# Patient Record
Sex: Female | Born: 1990 | Race: Black or African American | Hispanic: No | Marital: Single | Smoking: Current every day smoker
Health system: Southern US, Community
[De-identification: ages and names within clinical notes are randomized; demographics above are authoritative.]

---
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
## 2008-06-11 ENCOUNTER — Emergency Department (HOSPITAL_COMMUNITY): Admission: EM | Admit: 2008-06-11 | Discharge: 2008-06-11 | Payer: Self-pay | Admitting: Emergency Medicine

## 2008-11-10 ENCOUNTER — Emergency Department (HOSPITAL_COMMUNITY): Admission: EM | Admit: 2008-11-10 | Discharge: 2008-11-10 | Payer: Self-pay | Admitting: Emergency Medicine

## 2008-12-25 ENCOUNTER — Emergency Department (HOSPITAL_COMMUNITY): Admission: EM | Admit: 2008-12-25 | Discharge: 2008-12-25 | Payer: Self-pay | Admitting: Emergency Medicine

## 2014-08-31 ENCOUNTER — Emergency Department (HOSPITAL_COMMUNITY)
Admission: EM | Admit: 2014-08-31 | Discharge: 2014-08-31 | Disposition: A | Discharge status: Home / Self Care | Payer: No Typology Code available for payment source | Attending: Emergency Medicine | Admitting: Emergency Medicine

## 2014-08-31 ENCOUNTER — Encounter (HOSPITAL_COMMUNITY): Payer: Self-pay | Admitting: Emergency Medicine

## 2014-08-31 ENCOUNTER — Emergency Department (HOSPITAL_COMMUNITY): Payer: No Typology Code available for payment source

## 2014-08-31 DIAGNOSIS — Z720 Tobacco use: Secondary | ICD-10-CM | POA: Diagnosis not present

## 2014-08-31 DIAGNOSIS — Z041 Encounter for examination and observation following transport accident: Secondary | ICD-10-CM | POA: Diagnosis present

## 2014-08-31 DIAGNOSIS — Y9389 Activity, other specified: Secondary | ICD-10-CM | POA: Insufficient documentation

## 2014-08-31 DIAGNOSIS — Y999 Unspecified external cause status: Secondary | ICD-10-CM | POA: Insufficient documentation

## 2014-08-31 DIAGNOSIS — Y92410 Unspecified street and highway as the place of occurrence of the external cause: Secondary | ICD-10-CM | POA: Diagnosis not present

## 2014-08-31 MED ORDER — METHOCARBAMOL 500 MG PO TABS: 500.0000 mg | ORAL_TABLET | Freq: Two times a day (BID) | ORAL | Status: DC

## 2014-08-31 MED ORDER — NAPROXEN 500 MG PO TABS: 500.0000 mg | ORAL_TABLET | Freq: Two times a day (BID) | ORAL | Status: DC
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
## 2014-08-31 NOTE — ED Notes (Signed)
Pt ambulatory with steady gait to radiology 

## 2015-03-04 ENCOUNTER — Encounter (HOSPITAL_COMMUNITY): Payer: Self-pay

## 2015-03-04 DIAGNOSIS — Z79899 Other long term (current) drug therapy: Secondary | ICD-10-CM | POA: Insufficient documentation

## 2015-03-04 DIAGNOSIS — B349 Viral infection, unspecified: Secondary | ICD-10-CM | POA: Insufficient documentation

## 2015-03-04 DIAGNOSIS — R42 Dizziness and giddiness: Secondary | ICD-10-CM | POA: Insufficient documentation

## 2015-03-04 DIAGNOSIS — F17200 Nicotine dependence, unspecified, uncomplicated: Secondary | ICD-10-CM | POA: Insufficient documentation

## 2015-03-04 DIAGNOSIS — Z791 Long term (current) use of non-steroidal anti-inflammatories (NSAID): Secondary | ICD-10-CM | POA: Insufficient documentation

## 2015-03-04 DIAGNOSIS — Z3202 Encounter for pregnancy test, result negative: Secondary | ICD-10-CM | POA: Insufficient documentation

## 2015-03-04 LAB — CBC
HCT: 36.8 % (ref 36.0–46.0)
Hemoglobin: 12.3 g/dL (ref 12.0–15.0)
MCH: 28 pg (ref 26.0–34.0)
MCHC: 33.4 g/dL (ref 30.0–36.0)
MCV: 83.6 fL (ref 78.0–100.0)
PLATELETS: 337 10*3/uL (ref 150–400)
RBC: 4.4 MIL/uL (ref 3.87–5.11)
RDW: 13 % (ref 11.5–15.5)
WBC: 5.3 10*3/uL (ref 4.0–10.5)

## 2015-03-04 LAB — URINALYSIS, ROUTINE W REFLEX MICROSCOPIC
Bilirubin Urine: NEGATIVE
GLUCOSE, UA: NEGATIVE mg/dL
Ketones, ur: 15 mg/dL — AB
LEUKOCYTES UA: NEGATIVE
Nitrite: NEGATIVE
PROTEIN: NEGATIVE mg/dL
Specific Gravity, Urine: 1.028 (ref 1.005–1.030)
pH: 6 (ref 5.0–8.0)

## 2015-03-04 LAB — COMPREHENSIVE METABOLIC PANEL
ALT: 20 U/L (ref 14–54)
AST: 26 U/L (ref 15–41)
Albumin: 4.1 g/dL (ref 3.5–5.0)
Alkaline Phosphatase: 50 U/L (ref 38–126)
Anion gap: 10 (ref 5–15)
BUN: 12 mg/dL (ref 6–20)
CHLORIDE: 104 mmol/L (ref 101–111)
CO2: 24 mmol/L (ref 22–32)
CREATININE: 0.86 mg/dL (ref 0.44–1.00)
Calcium: 9.5 mg/dL (ref 8.9–10.3)
GFR calc non Af Amer: >60 mL/min (ref >60)
Glucose, Bld: 84 mg/dL (ref 65–99)
POTASSIUM: 3.8 mmol/L (ref 3.5–5.1)
SODIUM: 138 mmol/L (ref 135–145)
Total Bilirubin: 0.4 mg/dL (ref 0.3–1.2)
Total Protein: 6.7 g/dL (ref 6.5–8.1)

## 2015-03-04 LAB — URINE MICROSCOPIC-ADD ON

## 2015-03-04 LAB — LIPASE, BLOOD: LIPASE: 36 U/L (ref 11–51)

## 2015-03-04 LAB — POC URINE PREG, ED: PREG TEST UR: NEGATIVE

## 2015-03-04 NOTE — ED Notes (Signed)
Pt here for abd pain and diarrhea for the past three days. Feels nauseated but no vomiting. Has nasal congestion. Wants to sleep, has no energy.

## 2015-03-05 ENCOUNTER — Emergency Department (HOSPITAL_COMMUNITY)
Admission: EM | Admit: 2015-03-05 | Discharge: 2015-03-05 | Disposition: A | Discharge status: Home / Self Care | Payer: Self-pay | Attending: Emergency Medicine | Admitting: Emergency Medicine

## 2015-03-05 DIAGNOSIS — R05 Cough: Secondary | ICD-10-CM

## 2015-03-05 DIAGNOSIS — R197 Diarrhea, unspecified: Secondary | ICD-10-CM

## 2015-03-05 DIAGNOSIS — R059 Cough, unspecified: Secondary | ICD-10-CM

## 2015-03-05 DIAGNOSIS — B349 Viral infection, unspecified: Secondary | ICD-10-CM

## 2015-03-05 NOTE — ED Notes (Signed)
Nurse first rounds: pt updated on wait time.  

## 2015-03-05 NOTE — ED Provider Notes (Signed)
CSN: [id number]     Arrival date & time 03/04/15  2128 History  By signing my name below, I, Bethel Born, attest that this documentation has been prepared under the direction and in the presence of Zadie Rhine, MD. Electronically Signed: Bethel Born, ED Scribe. 03/05/2015. 1:57 AM   Chief Complaint  Patient presents with  . Abdominal Pain   Patient is a 25 y.o. female presenting with dizziness. The history is provided by the patient. No language interpreter was used.  Dizziness Quality:  Room spinning Severity:  Mild Onset quality:  Gradual Duration:  2 days Timing:  Intermittent Progression:  Unchanged Chronicity:  New Context: not with loss of consciousness   Relieved by:  Nothing Worsened by:  Nothing Ineffective treatments:  None tried Associated symptoms: diarrhea, headaches, nausea and weakness (generalized)   Associated symptoms: no chest pain, no shortness of breath, no syncope, no vision changes and no vomiting    Michele Saunders is a 25 y.o. female who presents to the Emergency Department complaining of new intermittent dizziness with onset 2 days ago. Associated symptoms include generalized weakness, sweating, chills, headache, cough, sore throat, nasal discharge, 3-4 days of nausea, diarrhea. Pt denies LOC, fever, abdominal pain, dysuria, vaginal bleeding, chest pain, and SOB.    History reviewed. No pertinent past medical history. History reviewed. No pertinent past surgical history. No family history on file. Social History  Substance Use Topics  . Smoking status: Current Every Day Smoker  . Smokeless tobacco: None  . Alcohol Use: No   OB History    No data available     Review of Systems  Constitutional: Positive for chills and diaphoresis. Negative for fever.  HENT: Positive for congestion, rhinorrhea and sore throat.   Respiratory: Positive for cough. Negative for shortness of breath.   Cardiovascular: Negative for chest pain and syncope.   Gastrointestinal: Positive for nausea and diarrhea. Negative for vomiting and abdominal pain.  Genitourinary: Negative for dysuria and vaginal bleeding.  Neurological: Positive for dizziness, weakness (generalized) and headaches.  All other systems reviewed and are negative.  Allergies  Review of patient's allergies indicates no known allergies.  Home Medications   Prior to Admission medications   Medication Sig Start Date End Date Taking? Authorizing Provider  methocarbamol (ROBAXIN) 500 MG tablet Take 1 tablet (500 mg total) by mouth 2 (two) times daily. 08/31/14   Heather Laisure, PA-C  naproxen (NAPROSYN) 500 MG tablet Take 1 tablet (500 mg total) by mouth 2 (two) times daily. 08/31/14   Heather Laisure, PA-C   BP 116/60 mmHg  Pulse 72  Temp(Src) 98.2 F (36.8 C) (Oral)  Resp 18  Ht  (1.448 m)  Wt 99 lb (44.906 kg)  BMI 21.42 kg/m2  SpO2 100%  LMP 02/08/2015 Physical Exam CONSTITUTIONAL: Well developed/well nourished HEAD: Normocephalic/atraumatic EYES: EOMI/PERRL ENMT: Mucous membranes moist NECK: supple no meningeal signs SPINE/BACK:entire spine nontender CV: S1/S2 noted, no murmurs/rubs/gallops noted LUNGS: Lungs are clear to auscultation bilaterally, no apparent distress ABDOMEN: soft, nontender, no rebound or guarding, bowel sounds noted throughout abdomen GU:no cva tenderness NEURO: Pt is awake/alert/appropriate, moves all extremitiesx4.  No facial droop. No ataxia.  EXTREMITIES: pulses normal/equal, full ROM SKIN: warm, color normal PSYCH: no abnormalities of mood noted, alert and oriented to situation  ED Course  Procedures  DIAGNOSTIC STUDIES: Oxygen Saturation is 100% on RA,  normal by my interpretation.    COORDINATION OF CARE: 1:55 AM Discussed treatment plan which includes lab work with pt at  bedside and pt agreed to plan.  Labs Review Labs Reviewed  URINALYSIS, ROUTINE W REFLEX MICROSCOPIC (NOT AT [facility]) - Abnormal; Notable for the following:     APPearance CLOUDY (*)    Hgb urine dipstick MODERATE (*)    Ketones, ur 15 (*)    All other components within normal limits  URINE MICROSCOPIC-ADD ON - Abnormal; Notable for the following:    Squamous Epithelial / LPF 0-5 (*)    Bacteria, UA MANY (*)    All other components within normal limits  LIPASE, BLOOD  COMPREHENSIVE METABOLIC PANEL  CBC  POC URINE PREG, ED    I have personally reviewed and evaluated these lab results as part of my medical decision-making.    MDM   Final diagnoses:  Diarrhea, unspecified type  Cough  Viral illness    Nursing notes including past medical history and social history reviewed and considered in documentation Labs/vital reviewed myself and considered during evaluation   I personally performed the services described in this documentation, which was scribed in my presence. The recorded information has been reviewed and is accurate.       Zadie Rhine, MD 03/05/15 [phone]

## 2015-03-05 NOTE — Discharge Instructions (Signed)

## 2019-08-09 ENCOUNTER — Emergency Department (HOSPITAL_COMMUNITY): Payer: Self-pay

## 2019-08-09 ENCOUNTER — Encounter (HOSPITAL_COMMUNITY): Payer: Self-pay | Admitting: Emergency Medicine

## 2019-08-09 ENCOUNTER — Observation Stay (HOSPITAL_COMMUNITY)
Admission: EM | Admit: 2019-08-09 | Discharge: 2019-08-10 | Disposition: A | Discharge status: Home / Self Care | Payer: Self-pay | Attending: Internal Medicine | Admitting: Internal Medicine

## 2019-08-09 DIAGNOSIS — F1210 Cannabis abuse, uncomplicated: Secondary | ICD-10-CM | POA: Insufficient documentation

## 2019-08-09 DIAGNOSIS — Z20822 Contact with and (suspected) exposure to covid-19: Secondary | ICD-10-CM | POA: Insufficient documentation

## 2019-08-09 DIAGNOSIS — E876 Hypokalemia: Secondary | ICD-10-CM | POA: Insufficient documentation

## 2019-08-09 DIAGNOSIS — F17200 Nicotine dependence, unspecified, uncomplicated: Secondary | ICD-10-CM | POA: Insufficient documentation

## 2019-08-09 DIAGNOSIS — E860 Dehydration: Secondary | ICD-10-CM | POA: Insufficient documentation

## 2019-08-09 DIAGNOSIS — R112 Nausea with vomiting, unspecified: Principal | ICD-10-CM | POA: Diagnosis present

## 2019-08-09 DIAGNOSIS — Z79899 Other long term (current) drug therapy: Secondary | ICD-10-CM | POA: Insufficient documentation

## 2019-08-09 DIAGNOSIS — Z791 Long term (current) use of non-steroidal anti-inflammatories (NSAID): Secondary | ICD-10-CM | POA: Insufficient documentation

## 2019-08-09 LAB — CBC
HCT: 43.3 % (ref 36.0–46.0)
Hemoglobin: 13.7 g/dL (ref 12.0–15.0)
MCH: 27.3 pg (ref 26.0–34.0)
MCHC: 31.6 g/dL (ref 30.0–36.0)
MCV: 86.4 fL (ref 80.0–100.0)
Platelets: 345 10*3/uL (ref 150–400)
RBC: 5.01 MIL/uL (ref 3.87–5.11)
RDW: 13 % (ref 11.5–15.5)
WBC: 8.8 10*3/uL (ref 4.0–10.5)
nRBC: 0 % (ref 0.0–0.2)

## 2019-08-09 LAB — I-STAT BETA HCG BLOOD, ED (MC, WL, AP ONLY): I-stat hCG, quantitative: <5 m[IU]/mL (ref <5)

## 2019-08-09 LAB — URINALYSIS, ROUTINE W REFLEX MICROSCOPIC
Bacteria, UA: NONE SEEN
Bilirubin Urine: NEGATIVE
Glucose, UA: NEGATIVE mg/dL
Ketones, ur: 20 mg/dL — AB
Leukocytes,Ua: NEGATIVE
Nitrite: NEGATIVE
Protein, ur: NEGATIVE mg/dL
Specific Gravity, Urine: 1.045 — ABNORMAL HIGH (ref 1.005–1.030)
pH: 8 (ref 5.0–8.0)

## 2019-08-09 LAB — MAGNESIUM: Magnesium: 2.5 mg/dL — ABNORMAL HIGH (ref 1.7–2.4)

## 2019-08-09 LAB — COMPREHENSIVE METABOLIC PANEL
ALT: 24 U/L (ref 0–44)
AST: 22 U/L (ref 15–41)
Albumin: 4.9 g/dL (ref 3.5–5.0)
Alkaline Phosphatase: 45 U/L (ref 38–126)
Anion gap: 14 (ref 5–15)
BUN: 10 mg/dL (ref 6–20)
CO2: 25 mmol/L (ref 22–32)
Calcium: 9.3 mg/dL (ref 8.9–10.3)
Chloride: 104 mmol/L (ref 98–111)
Creatinine, Ser: 0.89 mg/dL (ref 0.44–1.00)
GFR calc Af Amer: >60 mL/min (ref >60)
GFR calc non Af Amer: >60 mL/min (ref >60)
Glucose, Bld: 88 mg/dL (ref 70–99)
Potassium: 3 mmol/L — ABNORMAL LOW (ref 3.5–5.1)
Sodium: 143 mmol/L (ref 135–145)
Total Bilirubin: 1.1 mg/dL (ref 0.3–1.2)
Total Protein: 8.1 g/dL (ref 6.5–8.1)

## 2019-08-09 LAB — LIPASE, BLOOD: Lipase: 34 U/L (ref 11–51)

## 2019-08-09 MED ORDER — SODIUM CHLORIDE (PF) 0.9 % IJ SOLN
INTRAMUSCULAR | Status: AC
  Filled 2019-08-09: qty 50

## 2019-08-09 MED ORDER — PROMETHAZINE HCL 25 MG/ML IJ SOLN
12.5000 mg | Freq: Once | INTRAMUSCULAR | Status: AC
  Administered 2019-08-09: 12.5 mg via INTRAVENOUS
  Filled 2019-08-09: qty 1

## 2019-08-09 MED ORDER — SODIUM CHLORIDE 0.9 % IV SOLN
80.0000 mg | Freq: Once | INTRAVENOUS | Status: AC
  Administered 2019-08-09: 80 mg via INTRAVENOUS
  Filled 2019-08-09: qty 80

## 2019-08-09 MED ORDER — MORPHINE SULFATE (PF) 4 MG/ML IV SOLN
4.0000 mg | Freq: Once | INTRAVENOUS | Status: AC
  Administered 2019-08-09: 4 mg via INTRAVENOUS
  Filled 2019-08-09: qty 1

## 2019-08-09 MED ORDER — SODIUM CHLORIDE 0.9 % IV BOLUS
1000.0000 mL | Freq: Once | INTRAVENOUS | Status: AC
  Administered 2019-08-09: 1000 mL via INTRAVENOUS

## 2019-08-09 MED ORDER — SODIUM CHLORIDE 0.9 % IV SOLN: Freq: Once | INTRAVENOUS | Status: AC

## 2019-08-09 MED ORDER — METOCLOPRAMIDE HCL 5 MG/ML IJ SOLN
10.0000 mg | Freq: Once | INTRAMUSCULAR | Status: AC
  Administered 2019-08-09: 10 mg via INTRAVENOUS
  Filled 2019-08-09: qty 2

## 2019-08-09 MED ORDER — DROPERIDOL 2.5 MG/ML IJ SOLN
1.2500 mg | Freq: Once | INTRAMUSCULAR | Status: AC
  Administered 2019-08-09: 1.25 mg via INTRAVENOUS
  Filled 2019-08-09: qty 2

## 2019-08-09 MED ORDER — POTASSIUM CHLORIDE 10 MEQ/100ML IV SOLN
10.0000 meq | INTRAVENOUS | Status: AC
  Administered 2019-08-09 – 2019-08-10 (×4): 10 meq via INTRAVENOUS
  Filled 2019-08-09 (×4): qty 100

## 2019-08-09 MED ORDER — IOHEXOL 300 MG/ML  SOLN
100.0000 mL | Freq: Once | INTRAMUSCULAR | Status: AC | PRN
  Administered 2019-08-09: 100 mL via INTRAVENOUS

## 2019-08-09 MED ORDER — SODIUM CHLORIDE 0.9% FLUSH: 3.0000 mL | Freq: Once | INTRAVENOUS | Status: DC

## 2019-08-09 NOTE — ED Notes (Signed)
Pt was given cranberry juice 

## 2019-08-09 NOTE — ED Triage Notes (Signed)
Patient here via EMS with complaints of abd pain, n/v that started 2 days ago. Left upper and lower abd pain.

## 2019-08-09 NOTE — ED Provider Notes (Signed)
Emergency Department Provider Note   I have reviewed the triage vital signs and the nursing notes.   HISTORY  Chief Complaint Abdominal Pain, Nausea, and Emesis   HPI Michele Saunders is a 29 y.o. female with no chronic past medical history presents to the emergency department with nausea and vomiting with diffuse abdominal pain over the past 2 days.  Patient has not had symptoms like this in the past.  Her pain is mostly in the left upper and lower abdomen.  She denies any vaginal bleeding or discharge.  She ate at [employer] and became sick shortly afterwards.  In addition to vomiting she has had some associated nonbloody diarrhea.  She denies fevers.  She smokes marijuana occasionally but not daily.  Denies alcohol use.  She reports going to a hospital in [place] yesterday where an initial work-up was done and she was ultimately discharged home.  She is unsure what exact tests and/or imaging were ordered.  She continues to have symptoms and so came here for evaluation as she knows someone in the area. Denies CP or SOB. No cough. No radiation of symptoms or modifying factors.   History reviewed. No pertinent past medical history.  There are no problems to display for this patient.   History reviewed. No pertinent surgical history.  Allergies Patient has no known allergies.  No family history on file.  Social History Social History   Tobacco Use  . Smoking status: Current Every Day Smoker  . Smokeless tobacco: Never Used  Substance Use Topics  . Alcohol use: No  . Drug use: No    Review of Systems  Constitutional: No fever/chills Eyes: No visual changes. ENT: No sore throat. Cardiovascular: Denies chest pain. Respiratory: Denies shortness of breath. Gastrointestinal: Left sided abdominal pain. Positive nausea, vomiting, and diarrhea.  No constipation. Genitourinary: Negative for dysuria. Musculoskeletal: Negative for back pain. Skin: Negative for rash. Neurological:  Negative for headaches, focal weakness or numbness.  10-point ROS otherwise negative.  ____________________________________________   PHYSICAL EXAM:  VITAL SIGNS: ED Triage Vitals  Enc Vitals Group     BP 08/09/19 1102 120/73     Pulse Rate 08/09/19 1102 63     Resp 08/09/19 1102 16     Temp 08/09/19 1102 99.1 F (37.3 C)     Temp Source 08/09/19 1102 Oral     SpO2 08/09/19 1102 100 %   Constitutional: Alert and oriented. Appears uncomfortable and grabbing at her abdomen.  Eyes: Conjunctivae are normal.  Head: Atraumatic. Nose: No congestion/rhinnorhea. Mouth/Throat: Mucous membranes are dry.  Neck: No stridor.   Cardiovascular: Normal rate, regular rhythm. Good peripheral circulation. Grossly normal heart sounds.   Respiratory: Normal respiratory effort.  No retractions. Lungs CTAB. Gastrointestinal: Soft with diffuse tenderness worse in the LUQ and LLQ. No distention.  Musculoskeletal: No gross deformities of extremities. Neurologic:  Normal speech and language.  Skin:  Skin is warm, dry and intact. No rash noted.  ____________________________________________   LABS (all labs ordered are listed, but only abnormal results are displayed)  Labs Reviewed  COMPREHENSIVE METABOLIC PANEL - Abnormal; Notable for the following components:      Result Value   Potassium 3.0 (*)    All other components within normal limits  URINALYSIS, ROUTINE W REFLEX MICROSCOPIC - Abnormal; Notable for the following components:   Color, Urine STRAW (*)    Specific Gravity, Urine 1.045 (*)    Hgb urine dipstick SMALL (*)    Ketones, ur 20 (*)  All other components within normal limits  SARS CORONAVIRUS 2 BY RT PCR (HOSPITAL ORDER, PERFORMED IN [facility])  LIPASE, BLOOD  CBC  MAGNESIUM  I-STAT BETA HCG BLOOD, ED ([facility], [facility], [facility] ONLY)   ____________________________________________  RADIOLOGY  CT ABDOMEN PELVIS W CONTRAST  Result Date: 08/09/2019 CLINICAL DATA:  Nausea  vomiting EXAM: CT ABDOMEN AND PELVIS WITH CONTRAST TECHNIQUE: Multidetector CT imaging of the abdomen and pelvis was performed using the standard protocol following bolus administration of intravenous contrast. CONTRAST:  OMNIPAQUE IOHEXOL 300 MG/ML  SOLN COMPARISON:  None. FINDINGS: Lower chest: No acute abnormality. Hepatobiliary: No focal hepatic abnormality. Increased intraluminal density within the gallbladder. Note calcified stone. No biliary dilatation. Pancreas: Unremarkable. No pancreatic ductal dilatation or surrounding inflammatory changes. Spleen: Normal in size without focal abnormality. Adrenals/Urinary Tract: Adrenal glands are unremarkable. Kidneys are normal, without renal calculi, focal lesion, or hydronephrosis. Bladder is unremarkable. Stomach/Bowel: Stomach is within normal limits. Appendix appears normal. No evidence of bowel wall thickening, distention, or inflammatory changes. Vascular/Lymphatic: No significant vascular findings are present. No enlarged abdominal or pelvic lymph nodes. Reproductive: Tampon in the vagina.  No suspicious adnexal mass Other: No abdominal wall hernia or abnormality. No abdominopelvic ascites. Musculoskeletal: No acute or significant osseous findings. IMPRESSION: 1. No CT evidence for acute intra-abdominal or pelvic abnormality. 2. Possible gallbladder sludge. Electronically Signed   By: Jasmine Pang M.D.   On: 08/09/2019 19:14    ____________________________________________   PROCEDURES  Procedure(s) performed:   Procedures  CRITICAL CARE Performed by: Maia Plan Total critical care time: 35 minutes Critical care time was exclusive of separately billable procedures and treating other patients. Critical care was necessary to treat or prevent imminent or life-threatening deterioration. Critical care was time spent personally by me on the following activities: development of treatment plan with patient and/or surrogate as well as nursing,  discussions with consultants, evaluation of patient's response to treatment, examination of patient, obtaining history from patient or surrogate, ordering and performing treatments and interventions, ordering and review of laboratory studies, ordering and review of radiographic studies, pulse oximetry and re-evaluation of patient's condition.  Alona Bene, MD Emergency Medicine  ____________________________________________   INITIAL IMPRESSION / ASSESSMENT AND PLAN / ED COURSE  Pertinent labs & imaging results that were available during my care of the patient were reviewed by me and considered in my medical decision making (see chart for details).   Patient presents to the emergency department for evaluation of abdominal pain with nausea and vomiting over the past 2 days.  Abdomen is tender mainly on the left but somewhat tender diffusely.  She appears uncomfortable on arrival.  I do not have ability to review any results from her recent ED visit in [place].  She is unsure if any imaging was performed.  Given her presentation here and tenderness on exam as well as her overall uncomfortable appearance I do feel that abdominal imaging is warranted.  Her pregnancy test is negative.  Her lab work ordered from triage is largely unremarkable other than mild hypokalemia.  Plan for Reglan, Protonix, and morphine along with IV fluids pending CT to r/o underlying surgical process.   10:32 PM  Patient reevaluated after review of labs and CT imaging.  She has a mild hypokalemia to 3.0.  She continues to have vomiting after attempting to drink fluids on multiple occasions after Zofran and Phenergan.  Will obtain EKG and and droperidol.  Patient would likely benefit from potassium replacement IV,  IV fluids overnight with refractory nausea and vomiting.    EKG Interpretation  Date/Time:  Wednesday August 09 2019 22:44:31 EDT Ventricular Rate:  55 PR Interval:    QRS Duration: 98 QT Interval:  451 QTC  Calculation: 432 R Axis:   80 Text Interpretation: Sinus rhythm T wave inversion noted. No EKG for comparison. No STEMI Confirmed by Nanda Quinton [phone]) on 08/09/2019 11:07:39 PM      Discussed patient's case with [facility], Dr. Hal Hope to request admission. Patient and family (if present) updated with plan. Care transferred to [facility] service.  I reviewed all nursing notes, vitals, pertinent old records, EKGs, labs, imaging (as available).  QTc normal. Ok for additional nausea meds.  ____________________________________________  FINAL CLINICAL IMPRESSION(S) / ED DIAGNOSES  Final diagnoses:  Non-intractable vomiting with nausea, unspecified vomiting type  Dehydration  Hypokalemia     MEDICATIONS GIVEN DURING THIS VISIT:  Medications  sodium chloride flush (NS) 0.9 % injection 3 mL (has no administration in time range)  sodium chloride (PF) 0.9 % injection (has no administration in time range)  droperidol (INAPSINE) 2.5 MG/ML injection 1.25 mg (has no administration in time range)  0.9 %  sodium chloride infusion (has no administration in time range)  potassium chloride 10 mEq in 100 mL IVPB (has no administration in time range)  sodium chloride 0.9 % bolus 1,000 mL (0 mLs Intravenous Stopped 08/09/19 2004)  metoCLOPramide (REGLAN) injection 10 mg (10 mg Intravenous Given 08/09/19 1747)  pantoprazole (PROTONIX) 80 mg in sodium chloride 0.9 % 100 mL IVPB (0 mg Intravenous Stopped 08/09/19 1853)  morphine 4 MG/ML injection 4 mg (4 mg Intravenous Given 08/09/19 1748)  iohexol (OMNIPAQUE) 300 MG/ML solution 100 mL (100 mLs Intravenous Contrast Given 08/09/19 1822)  promethazine (PHENERGAN) injection 12.5 mg (12.5 mg Intravenous Given 08/09/19 2051)    Note:  This document was prepared using Dragon voice recognition software and may include unintentional dictation errors.  Nanda Quinton, MD, [facility] Emergency Medicine    Rosaland Shiffman, [facility], MD 08/09/19 [phone]

## 2019-08-09 NOTE — ED Notes (Signed)
Repeated PO challenge with water, pt states "so far so good".

## 2019-08-10 ENCOUNTER — Encounter (HOSPITAL_COMMUNITY): Payer: Self-pay | Admitting: Internal Medicine

## 2019-08-10 ENCOUNTER — Other Ambulatory Visit: Payer: Self-pay

## 2019-08-10 DIAGNOSIS — R112 Nausea with vomiting, unspecified: Principal | ICD-10-CM

## 2019-08-10 LAB — BASIC METABOLIC PANEL
Anion gap: 2 — ABNORMAL LOW (ref 5–15)
BUN: 9 mg/dL (ref 6–20)
CO2: 28 mmol/L (ref 22–32)
Calcium: 8.8 mg/dL — ABNORMAL LOW (ref 8.9–10.3)
Chloride: 108 mmol/L (ref 98–111)
Creatinine, Ser: 0.83 mg/dL (ref 0.44–1.00)
GFR calc Af Amer: >60 mL/min (ref >60)
GFR calc non Af Amer: >60 mL/min (ref >60)
Glucose, Bld: 82 mg/dL (ref 70–99)
Potassium: 3.7 mmol/L (ref 3.5–5.1)
Sodium: 138 mmol/L (ref 135–145)

## 2019-08-10 LAB — HEPATIC FUNCTION PANEL
ALT: 19 U/L (ref 0–44)
AST: 18 U/L (ref 15–41)
Albumin: 4 g/dL (ref 3.5–5.0)
Alkaline Phosphatase: 35 U/L — ABNORMAL LOW (ref 38–126)
Bilirubin, Direct: <0.1 mg/dL (ref 0.0–0.2)
Total Bilirubin: 1.3 mg/dL — ABNORMAL HIGH (ref 0.3–1.2)
Total Protein: 6.6 g/dL (ref 6.5–8.1)

## 2019-08-10 LAB — HIV ANTIBODY (ROUTINE TESTING W REFLEX): HIV Screen 4th Generation wRfx: NONREACTIVE

## 2019-08-10 LAB — RAPID URINE DRUG SCREEN, HOSP PERFORMED
Amphetamines: NOT DETECTED
Barbiturates: NOT DETECTED
Benzodiazepines: NOT DETECTED
Cocaine: NOT DETECTED
Opiates: POSITIVE — AB
Tetrahydrocannabinol: POSITIVE — AB

## 2019-08-10 LAB — CBC WITH DIFFERENTIAL/PLATELET
Abs Immature Granulocytes: 0.03 10*3/uL (ref 0.00–0.07)
Basophils Absolute: 0.1 10*3/uL (ref 0.0–0.1)
Basophils Relative: 1 %
Eosinophils Absolute: 0.1 10*3/uL (ref 0.0–0.5)
Eosinophils Relative: 1 %
HCT: 36.6 % (ref 36.0–46.0)
Hemoglobin: 11.5 g/dL — ABNORMAL LOW (ref 12.0–15.0)
Immature Granulocytes: 0 %
Lymphocytes Relative: 44 %
Lymphs Abs: 3.8 10*3/uL (ref 0.7–4.0)
MCH: 27.3 pg (ref 26.0–34.0)
MCHC: 31.4 g/dL (ref 30.0–36.0)
MCV: 86.7 fL (ref 80.0–100.0)
Monocytes Absolute: 0.6 10*3/uL (ref 0.1–1.0)
Monocytes Relative: 7 %
Neutro Abs: 4 10*3/uL (ref 1.7–7.7)
Neutrophils Relative %: 47 %
Platelets: 251 10*3/uL (ref 150–400)
RBC: 4.22 MIL/uL (ref 3.87–5.11)
RDW: 13.2 % (ref 11.5–15.5)
WBC: 8.6 10*3/uL (ref 4.0–10.5)
nRBC: 0 % (ref 0.0–0.2)

## 2019-08-10 LAB — TSH: TSH: 2.518 u[IU]/mL (ref 0.350–4.500)

## 2019-08-10 LAB — SARS CORONAVIRUS 2 BY RT PCR (HOSPITAL ORDER, PERFORMED IN ~~LOC~~ HOSPITAL LAB): SARS Coronavirus 2: NEGATIVE

## 2019-08-10 MED ORDER — ONDANSETRON HCL 4 MG PO TABS: 4.0000 mg | ORAL_TABLET | Freq: Four times a day (QID) | ORAL | Status: DC | PRN

## 2019-08-10 MED ORDER — LACTATED RINGERS IV SOLN: INTRAVENOUS | Status: DC

## 2019-08-10 MED ORDER — ENOXAPARIN SODIUM 30 MG/0.3ML ~~LOC~~ SOLN
30.0000 mg | SUBCUTANEOUS | Status: DC
  Administered 2019-08-10: 30 mg via SUBCUTANEOUS
  Filled 2019-08-10: qty 0.3

## 2019-08-10 MED ORDER — ONDANSETRON HCL 4 MG/2ML IJ SOLN: 4.0000 mg | Freq: Four times a day (QID) | INTRAMUSCULAR | Status: DC | PRN

## 2019-08-10 NOTE — H&P (Signed)
History and Physical    Michele Saunders MGQ:[id number] DOB: 1991-02-15 DOA: 08/09/2019  PCP: Patient, No Pcp Per   Patient coming from: Home.  Chief Complaint: Nausea vomiting.  HPI: Michele Saunders is a 29 y.o. female with no significant past medical history presents to the ER with complaint of persistent nausea vomiting.  Patient states he has been having the symptoms for last 3 days had gone to ER for the same.  Was symptomatically treated and discharged home.  Patient states she also occasionally has diarrhea.  Admits to taking marijuana.  Denies any abdominal pain chest pain shortness of breath fever chills or any sick contacts.  No recent intake of any antibiotics.  ED Course: In the ER patient continued to have vomiting CT abdomen pelvis was unremarkable possible gallbladder sludge was seen.  Labs are remarkable for mild hypokalemia with potassium of 3 urine drug screen is pending Covid test is negative.  EKG shows sinus bradycardia with heart rate around 57 bpm with acceptable levels of QTC and QRS.  Nonspecific T wave changes.  Patient admitted for further observation.  Review of Systems: As per HPI, rest all negative.   History reviewed. No pertinent past medical history.  History reviewed. No pertinent surgical history.   reports that she has been smoking. She has never used smokeless tobacco. She reports current drug use. Drug: Marijuana. She reports that she does not drink alcohol.  No Known Allergies  Family History  Problem Relation Age of Onset  . Diabetes Mellitus II Neg Hx     Prior to Admission medications   Medication Sig Start Date End Date Taking? Authorizing Provider  famotidine (PEPCID) 20 MG tablet Take 20 mg by mouth 2 (two) times daily. 08/07/19  Yes [provider]  methocarbamol (ROBAXIN) 500 MG tablet Take 1 tablet (500 mg total) by mouth 2 (two) times daily. Patient not taking: Reported on 08/09/2019 08/31/14   Santiago Glad, PA-C  naproxen  (NAPROSYN) 500 MG tablet Take 1 tablet (500 mg total) by mouth 2 (two) times daily. Patient not taking: Reported on 08/09/2019 08/31/14   Santiago Glad, PA-C    Physical Exam: Constitutional: Moderately built and nourished. Vitals:   08/09/19 2200 08/10/19 0030 08/10/19 0230 08/10/19 0300  BP: 103/60 128/65 108/65 120/74  Pulse: 62 (!) 51 (!) 54 79  Resp: 14 20 18 19   Temp:      TempSrc:      SpO2: 100% 100% 100% 100%  Weight:      Height:       Eyes: Anicteric no pallor. ENMT: No discharge from the ears eyes nose or mouth. Neck: No mass felt.  No neck rigidity. Respiratory: No rhonchi or crepitations. Cardiovascular: S1-S2 heard. Abdomen: Soft nontender bowel sound present. Musculoskeletal: No edema. Skin: No rash. Neurologic: Alert awake oriented time place and person.  Moves all extremities. Psychiatric: Appears normal per normal affect.   Labs on Admission: I have personally reviewed following labs and imaging studies  CBC: Recent Labs  Lab 08/09/19 1411  WBC 8.8  HGB 13.7  HCT 43.3  MCV 86.4  PLT 345   Basic Metabolic Panel: Recent Labs  Lab 08/09/19 1411  NA 143  K 3.0*  CL 104  CO2 25  GLUCOSE 88  BUN 10  CREATININE 0.89  CALCIUM 9.3  MG 2.5*   GFR: Estimated Creatinine Clearance: 55.8 mL/min (by C-G formula based on SCr of 0.89 mg/dL). Liver Function Tests: Recent Labs  Lab 08/09/19 1411  AST 22  ALT 24  ALKPHOS 45  BILITOT 1.1  PROT 8.1  ALBUMIN 4.9   Recent Labs  Lab 08/09/19 1411  LIPASE 34   No results for input(s): AMMONIA in the last 168 hours. Coagulation Profile: No results for input(s): INR, PROTIME in the last 168 hours. Cardiac Enzymes: No results for input(s): CKTOTAL, CKMB, CKMBINDEX, TROPONINI in the last 168 hours. BNP (last 3 results) No results for input(s): PROBNP in the last 8760 hours. HbA1C: No results for input(s): HGBA1C in the last 72 hours. CBG: No results for input(s): GLUCAP in the last 168  hours. Lipid Profile: No results for input(s): CHOL, HDL, LDLCALC, TRIG, CHOLHDL, LDLDIRECT in the last 72 hours. Thyroid Function Tests: No results for input(s): TSH, T4TOTAL, FREET4, T3FREE, THYROIDAB in the last 72 hours. Anemia Panel: No results for input(s): VITAMINB12, FOLATE, FERRITIN, TIBC, IRON, RETICCTPCT in the last 72 hours. Urine analysis:    Component Value Date/Time   COLORURINE STRAW (A) 08/09/2019 1854   APPEARANCEUR CLEAR 08/09/2019 1854   LABSPEC 1.045 (H) 08/09/2019 1854   PHURINE 8.0 08/09/2019 1854   GLUCOSEU NEGATIVE 08/09/2019 1854   [place] (A) 08/09/2019 Eugenio Saenz NEGATIVE 08/09/2019 1854   KETONESUR 20 (A) 08/09/2019 1854   PROTEINUR NEGATIVE 08/09/2019 1854   NITRITE NEGATIVE 08/09/2019 1854   LEUKOCYTESUR NEGATIVE 08/09/2019 1854   Sepsis Labs: @LABRCNTIP (procalcitonin:4,lacticidven:4) ) Recent Results (from the past 240 hour(s))  SARS Coronavirus 2 by RT PCR (hospital order, performed in [place] hospital lab) Nasopharyngeal Nasopharyngeal Swab     Status: None   Collection Time: 08/09/19 11:30 PM   Specimen: Nasopharyngeal Swab  Result Value Ref Range Status   SARS Coronavirus 2 NEGATIVE NEGATIVE Final    Comment: (NOTE) SARS-CoV-2 target nucleic acids are NOT DETECTED.  The SARS-CoV-2 RNA is generally detectable in upper and lower respiratory specimens during the acute phase of infection. The lowest concentration of SARS-CoV-2 viral copies this assay can detect is 250 copies / mL. A negative result does not preclude SARS-CoV-2 infection and should not be used as the sole basis for treatment or other patient management decisions.  A negative result may occur with improper specimen collection / handling, submission of specimen other than nasopharyngeal swab, presence of viral mutation(s) within the areas targeted by this assay, and inadequate number of viral copies (<250 copies / mL). A negative result must be combined with  clinical observations, patient history, and epidemiological information.  Fact Sheet for Patients:   StrictlyIdeas.no  Fact Sheet for Healthcare Providers: BankingDealers.co.za  This test is not yet approved or  cleared by the [place] FDA and has been authorized for detection and/or diagnosis of SARS-CoV-2 by FDA under an Emergency Use Authorization (EUA).  This EUA will remain in effect (meaning this test can be used) for the duration of the COVID-19 declaration under Section 564(b)(1) of the Act, 21 U.S.C. section 360bbb-3(b)(1), unless the authorization is terminated or revoked sooner.  Performed at [facility], [place] [address] [place], [place] [address]      Radiological Exams on Admission: CT ABDOMEN PELVIS W CONTRAST  Result Date: 08/09/2019 CLINICAL DATA:  Nausea vomiting EXAM: CT ABDOMEN AND PELVIS WITH CONTRAST TECHNIQUE: Multidetector CT imaging of the abdomen and pelvis was performed using the standard protocol following bolus administration of intravenous contrast. CONTRAST:  153mL OMNIPAQUE IOHEXOL 300 MG/ML  SOLN COMPARISON:  None. FINDINGS: Lower chest: No acute abnormality. Hepatobiliary: No focal hepatic abnormality. Increased intraluminal density  within the gallbladder. Note calcified stone. No biliary dilatation. Pancreas: Unremarkable. No pancreatic ductal dilatation or surrounding inflammatory changes. Spleen: Normal in size without focal abnormality. Adrenals/Urinary Tract: Adrenal glands are unremarkable. Kidneys are normal, without renal calculi, focal lesion, or hydronephrosis. Bladder is unremarkable. Stomach/Bowel: Stomach is within normal limits. Appendix appears normal. No evidence of bowel wall thickening, distention, or inflammatory changes. Vascular/Lymphatic: No significant vascular findings are present. No enlarged abdominal or pelvic lymph nodes. Reproductive: Tampon in the vagina.  No  suspicious adnexal mass Other: No abdominal wall hernia or abnormality. No abdominopelvic ascites. Musculoskeletal: No acute or significant osseous findings. IMPRESSION: 1. No CT evidence for acute intra-abdominal or pelvic abnormality. 2. Possible gallbladder sludge. Electronically Signed   By: Jasmine Pang M.D.   On: 08/09/2019 19:14    EKG: Independently reviewed.  Sinus bradycardia with heart rate around 57 bpm with T wave changes.  Assessment/Plan Principal Problem:   Nausea & vomiting    1. Intractable nausea vomiting cause not clear.  CT abdomen unremarkable except for possible gallbladder sludge but LFTs are normal.  Will gently hydrate and advance diet as tolerated repeat LFTs. 2. Mild hypokalemia likely from vomiting.  EKG does not show any definite changes with regard to hypokalemia.  Replace and recheck. 3. Marijuana abuse could likely cause patient's vomiting.  Advised about quitting social work consult.   DVT prophylaxis: Lovenox. Code Status: Full code. Family Communication: Discussed with patient. Disposition Plan: Home. Consults called: None. Admission status: Observation.   Eduard Clos MD [facility] Pager [phone].  If 7PM-7AM, please contact night-coverage [email or web address] Password [facility]  08/10/2019, 3:47 AM

## 2019-08-10 NOTE — Discharge Summary (Signed)
Discharge Summary  Michele Saunders DDU:[id number] DOB: [birth year]  PCP: Patient, No Pcp Per  Admit date: 08/09/2019 Discharge date: 08/10/2019  Time spent: 40 mins      Recommendations for Outpatient Follow-up:  1. To establish care with a PCP at [facility] wellness for follow-up  Discharge Diagnoses:  Active Hospital Problems   Diagnosis Date Noted  . Nausea & vomiting 08/09/2019    Resolved Hospital Problems  No resolved problems to display.    Discharge Condition: Stable  Diet recommendation: As tolerated  Vitals:   08/10/19 1109 08/10/19 1200  BP: 135/79 111/74  Pulse: (!) 57 81  Resp: 16 18  Temp: 99 F (37.2 C)   SpO2: 100% 100%    History of present illness:  Michele Saunders is a 29 y.o. female with no significant past medical history presents to the ER with complaint of persistent nausea vomiting.  Patient states he has been having the symptoms for last 3 days had gone to ER for the same.  Was symptomatically treated and discharged home. Patient states she also occasionally had diarrhea.    Of note, patient reports all her symptoms started after eating a roast beef sandwich from [employer], stated she may have had food poisoning.  Patient admits to using marijuana socially/occasionally.  Denies any abdominal pain chest pain shortness of breath fever chills or any sick contacts.  No recent intake of any antibiotics. In the ER patient continued to have vomiting CT abdomen pelvis was unremarkable possible gallbladder sludge was seen.  Labs are remarkable for mild hypokalemia, UDS positive for opiates and marijuana. Covid test is negative.  EKG shows sinus bradycardia with heart rate around 57 bpm with acceptable levels of QTC and QRS.  Nonspecific T wave changes.  Patient admitted for further observation.    Today, patient was able to tolerate breakfast and lunch without any nausea/vomiting, denies any abdominal pain, fever/chills, or any diarrhea.  Denies any chest pain,  shortness of breath, dysuria, dizziness.  Patient eager to be discharged.   Hospital Course:  Principal Problem:   Nausea & vomiting   Intractable nausea/vomiting Unclear etiology, likely 2/2 gastroenteritis/food poisoning as per patient Patient also uses marijuana occasionally which could cause intractable nausea/vomiting Currently afebrile, with no leukocytosis LFTs WNL, except for mildly elevated T bili CT abdomen unremarkable except for possible gallbladder sludge Patient to advance diet as tolerated Advised to establish care with  wellness for follow-up  Marijuana abuse UDS positive for marijuana, opiates (likely 2/2 narcotics received in the ED) Patient advised to quit         Malnutrition Type:      Malnutrition Characteristics:      Nutrition Interventions:      Estimated body mass index is 21.47 kg/m as calculated from the following:   Height as of this encounter: 4\' 9"  (1.448 m).   Weight as of this encounter: 45 kg.    Procedures:  None  Consultations:  None  Discharge Exam: BP 111/74   Pulse 81   Temp 99 F (37.2 C) (Oral)   Resp 18   Ht 4\' 9"  (1.448 m)   Wt 45 kg   SpO2 100%   BMI 21.47 kg/m   General: NAD Cardiovascular: S1, S2 present Respiratory: CTA B    Discharge Instructions You were cared for by a hospitalist during your hospital stay. If you have any questions about your discharge medications or the care you received while you were in the hospital after you  are discharged, you can call the unit and asked to speak with the hospitalist on call if the hospitalist that took care of you is not available. Once you are discharged, your primary care physician will handle any further medical issues. Please note that NO REFILLS for any discharge medications will be authorized once you are discharged, as it is imperative that you return to your primary care physician (or establish a relationship with a primary care  physician if you do not have one) for your aftercare needs so that they can reassess your need for medications and monitor your lab values.  Discharge Instructions    Diet - low sodium heart healthy   Complete by: As directed    Increase activity slowly   Complete by: As directed      Allergies as of 08/10/2019   No Known Allergies     Medication List    STOP taking these medications   methocarbamol 500 MG tablet Commonly known as: ROBAXIN   naproxen 500 MG tablet Commonly known as: NAPROSYN     TAKE these medications   famotidine 20 MG tablet Commonly known as: PEPCID Take 20 mg by mouth 2 (two) times daily.      No Known Allergies  Follow-up Information    [facility]. Schedule an appointment as soon as possible for a visit in 1 week(s).   Why: Recommend to call to establish care with a doctor for follow up. They can see you without insurance. If they give you an appointment, you must go to the appointment or call to reschedule if that time doesnt work for you. Contact information: [address] [place] [address] [phone]               The results of significant diagnostics from this hospitalization (including imaging, microbiology, ancillary and laboratory) are listed below for reference.    Significant Diagnostic Studies: CT ABDOMEN PELVIS W CONTRAST  Result Date: 08/09/2019 CLINICAL DATA:  Nausea vomiting EXAM: CT ABDOMEN AND PELVIS WITH CONTRAST TECHNIQUE: Multidetector CT imaging of the abdomen and pelvis was performed using the standard protocol following bolus administration of intravenous contrast. CONTRAST:  168mL OMNIPAQUE IOHEXOL 300 MG/ML  SOLN COMPARISON:  None. FINDINGS: Lower chest: No acute abnormality. Hepatobiliary: No focal hepatic abnormality. Increased intraluminal density within the gallbladder. Note calcified stone. No biliary dilatation. Pancreas: Unremarkable. No pancreatic ductal  dilatation or surrounding inflammatory changes. Spleen: Normal in size without focal abnormality. Adrenals/Urinary Tract: Adrenal glands are unremarkable. Kidneys are normal, without renal calculi, focal lesion, or hydronephrosis. Bladder is unremarkable. Stomach/Bowel: Stomach is within normal limits. Appendix appears normal. No evidence of bowel wall thickening, distention, or inflammatory changes. Vascular/Lymphatic: No significant vascular findings are present. No enlarged abdominal or pelvic lymph nodes. Reproductive: Tampon in the vagina.  No suspicious adnexal mass Other: No abdominal wall hernia or abnormality. No abdominopelvic ascites. Musculoskeletal: No acute or significant osseous findings. IMPRESSION: 1. No CT evidence for acute intra-abdominal or pelvic abnormality. 2. Possible gallbladder sludge. Electronically Signed   By: Donavan Foil M.D.   On: 08/09/2019 19:14    Microbiology: Recent Results (from the past 240 hour(s))  SARS Coronavirus 2 by RT PCR (hospital order, performed in [facility] hospital lab) Nasopharyngeal Nasopharyngeal Swab     Status: None   Collection Time: 08/09/19 11:30 PM   Specimen: Nasopharyngeal Swab  Result Value Ref Range Status   SARS Coronavirus 2 NEGATIVE NEGATIVE Final  Comment: (NOTE) SARS-CoV-2 target nucleic acids are NOT DETECTED.  The SARS-CoV-2 RNA is generally detectable in upper and lower respiratory specimens during the acute phase of infection. The lowest concentration of SARS-CoV-2 viral copies this assay can detect is 250 copies / mL. A negative result does not preclude SARS-CoV-2 infection and should not be used as the sole basis for treatment or other patient management decisions.  A negative result may occur with improper specimen collection / handling, submission of specimen other than nasopharyngeal swab, presence of viral mutation(s) within the areas targeted by this assay, and inadequate number of viral copies (<250 copies /  mL). A negative result must be combined with clinical observations, patient history, and epidemiological information.  Fact Sheet for Patients:   BoilerBrush.com.cy  Fact Sheet for Healthcare Providers: https://pope.com/  This test is not yet approved or  cleared by the [place] FDA and has been authorized for detection and/or diagnosis of SARS-CoV-2 by FDA under an Emergency Use Authorization (EUA).  This EUA will remain in effect (meaning this test can be used) for the duration of the COVID-19 declaration under Section 564(b)(1) of the Act, 21 U.S.C. section 360bbb-3(b)(1), unless the authorization is terminated or revoked sooner.  Performed at [facility], [address], [place], [place] [address]      Labs: Basic Metabolic Panel: Recent Labs  Lab 08/09/19 1411 08/10/19 0445  NA 143 138  K 3.0* 3.7  CL 104 108  CO2 25 28  GLUCOSE 88 82  BUN 10 9  CREATININE 0.89 0.83  CALCIUM 9.3 8.8*  MG 2.5*  --    Liver Function Tests: Recent Labs  Lab 08/09/19 1411 08/10/19 0445  AST 22 18  ALT 24 19  ALKPHOS 45 35*  BILITOT 1.1 1.3*  PROT 8.1 6.6  ALBUMIN 4.9 4.0   Recent Labs  Lab 08/09/19 1411  LIPASE 34   No results for input(s): AMMONIA in the last 168 hours. CBC: Recent Labs  Lab 08/09/19 1411 08/10/19 0550  WBC 8.8 8.6  NEUTROABS  --  4.0  HGB 13.7 11.5*  HCT 43.3 36.6  MCV 86.4 86.7  PLT 345 251   Cardiac Enzymes: No results for input(s): CKTOTAL, CKMB, CKMBINDEX, TROPONINI in the last 168 hours. BNP: BNP (last 3 results) No results for input(s): BNP in the last 8760 hours.  ProBNP (last 3 results) No results for input(s): PROBNP in the last 8760 hours.  CBG: No results for input(s): GLUCAP in the last 168 hours.     Signed:  Briant Cedar, MD [facility] 08/10/2019, 1:48 PM

## 2021-09-17 IMAGING — CT CT ABD-PELV W/ CM
2 of 4 series · 16 of 46 positions shown, 18 images · IV contrast (omnipaque)
Comparison: None.

CLINICAL DATA: Nausea vomiting

EXAM:
CT ABDOMEN AND PELVIS WITH CONTRAST
TECHNIQUE: Multidetector CT imaging of the abdomen and pelvis was performed
using the standard protocol following bolus administration of
intravenous contrast.
CONTRAST:  100mL OMNIPAQUE IOHEXOL 300 MG/ML  SOLN

[Series 2: axial st · axial · 0.64mm/px · z∈[-582,-252]mm · 13 of 76 slices shown, 15 images]
[im 5/76  soft-tissue]
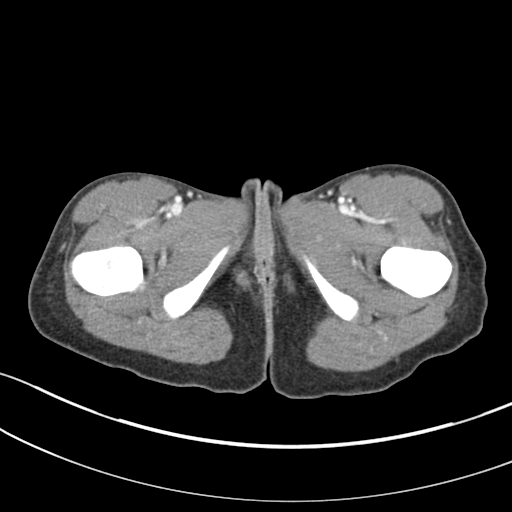
[im 5/76  bone]
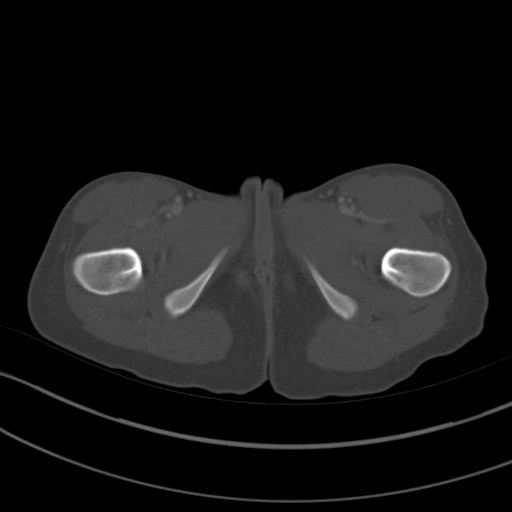
[im 9/76  soft-tissue]
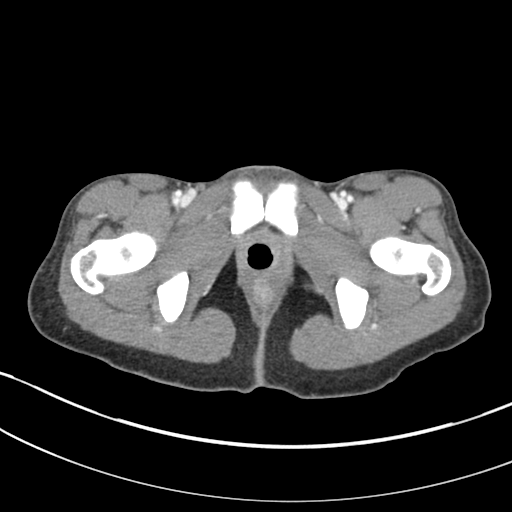
[im 17/76  soft-tissue]
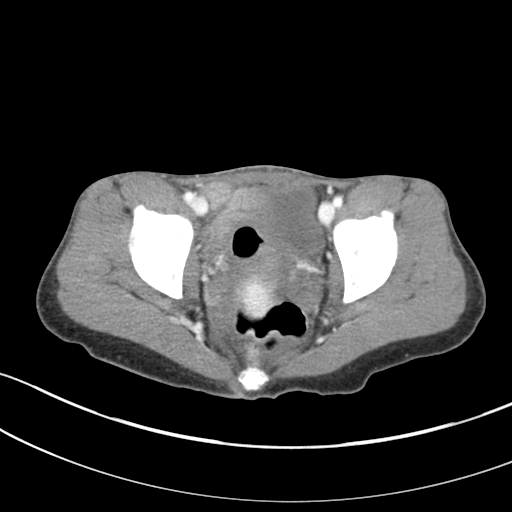
[im 21/76  soft-tissue]
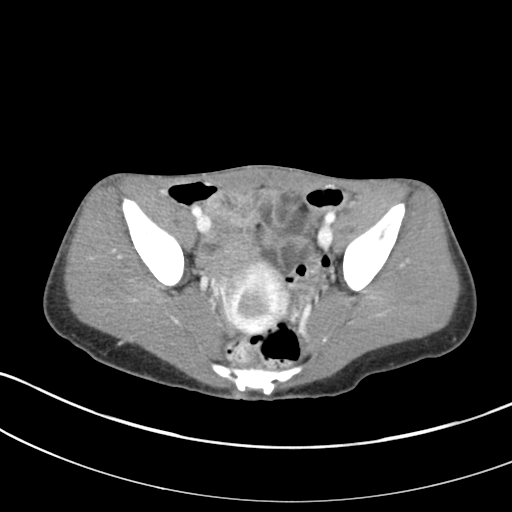
[im 26/76  soft-tissue]
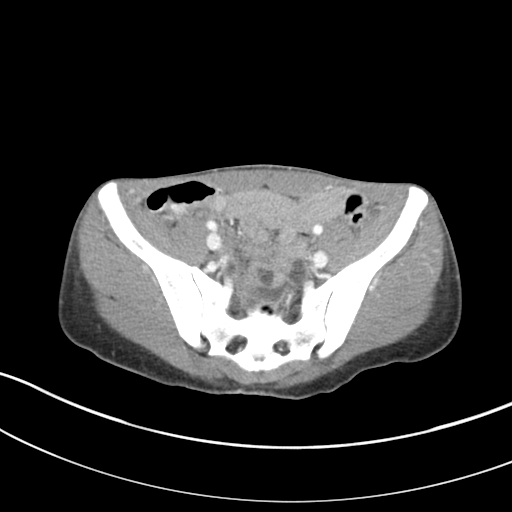
[im 34/76  soft-tissue]
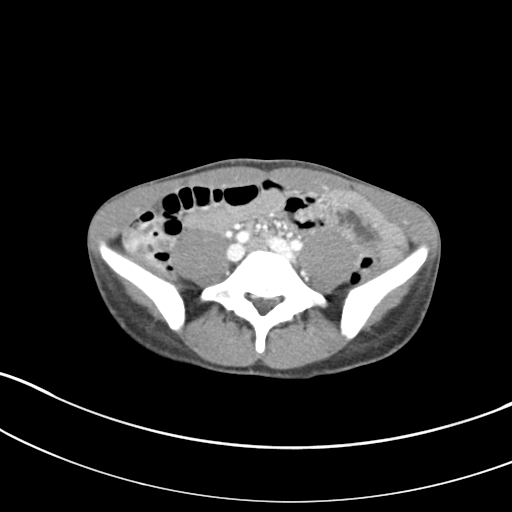
[im 38/76  soft-tissue]
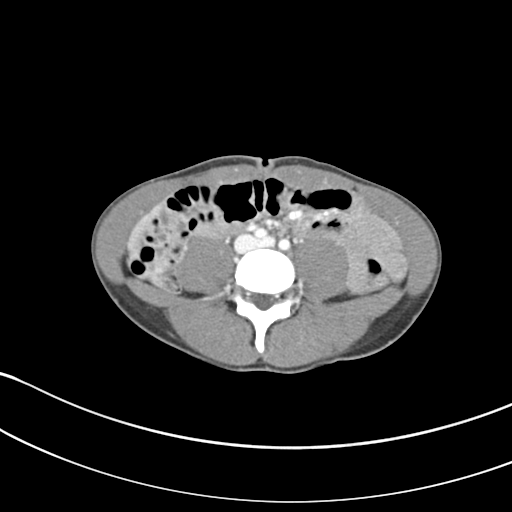
[im 42/76  soft-tissue]
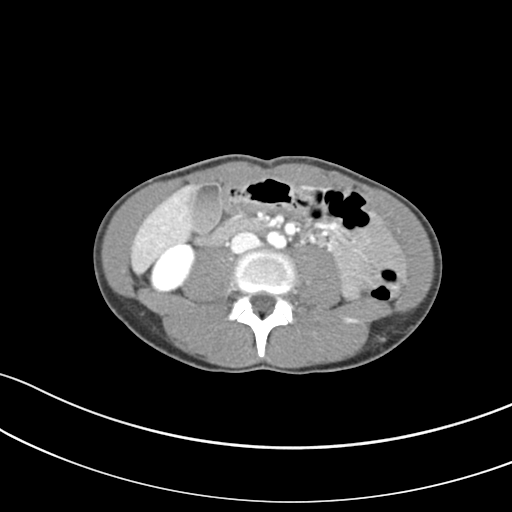
[im 51/76  soft-tissue]
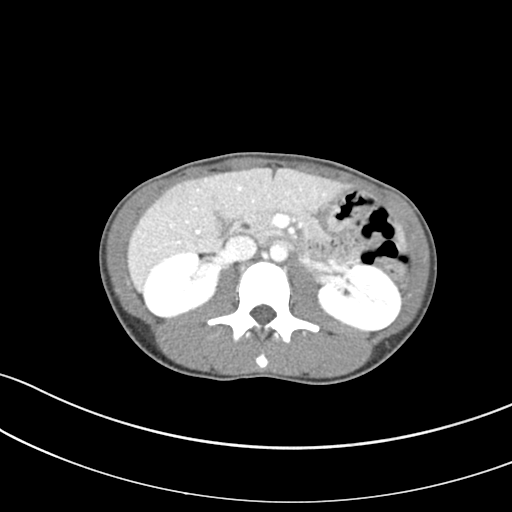
[im 51/76  bone]
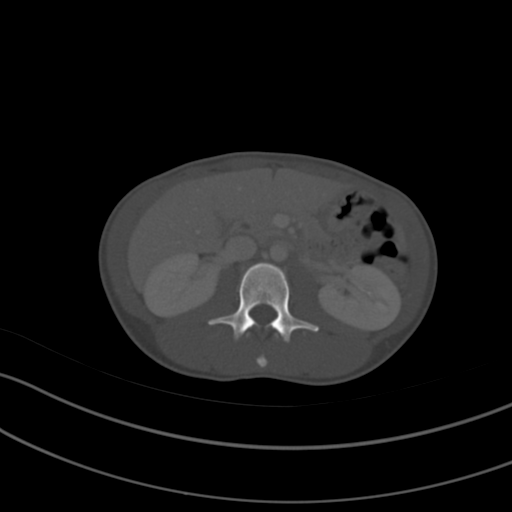
[im 55/76  soft-tissue]
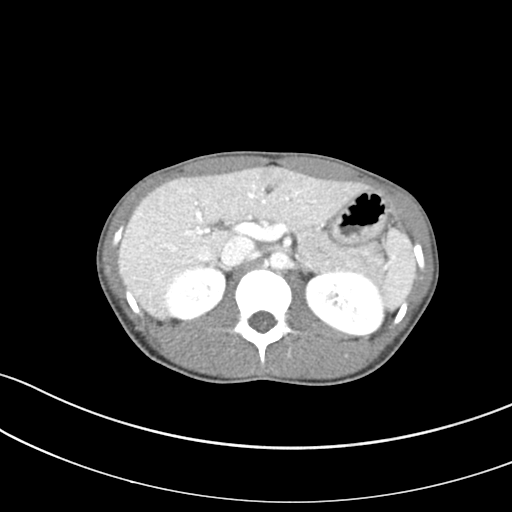
[im 59/76  soft-tissue]
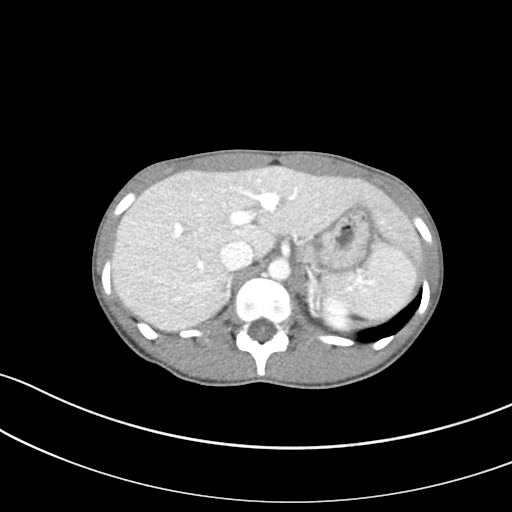
[im 67/76  soft-tissue]
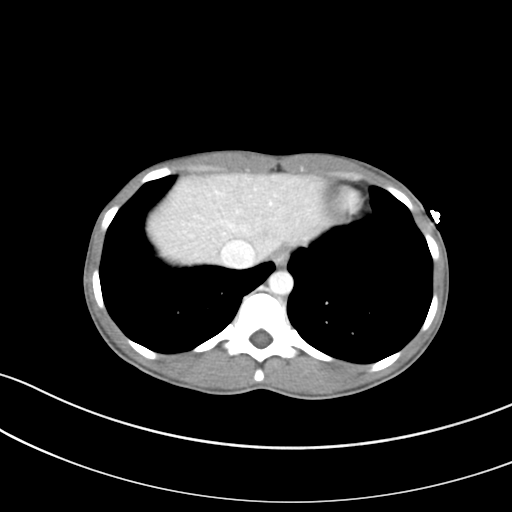
[im 71/76  soft-tissue]
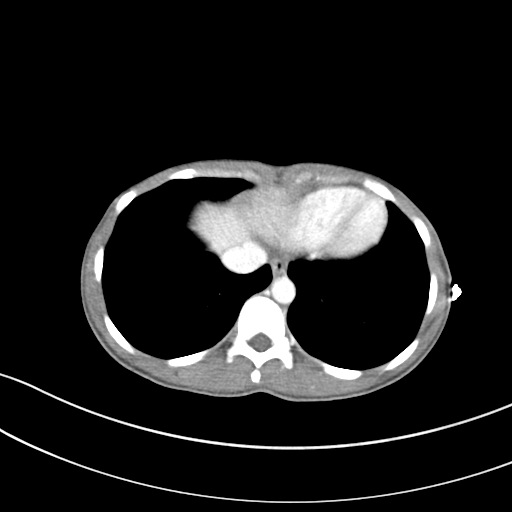

[Series 4: coronal st · coronal · 0.56mm/px · 3 of 82 slices shown]
[im 28/82  soft-tissue]
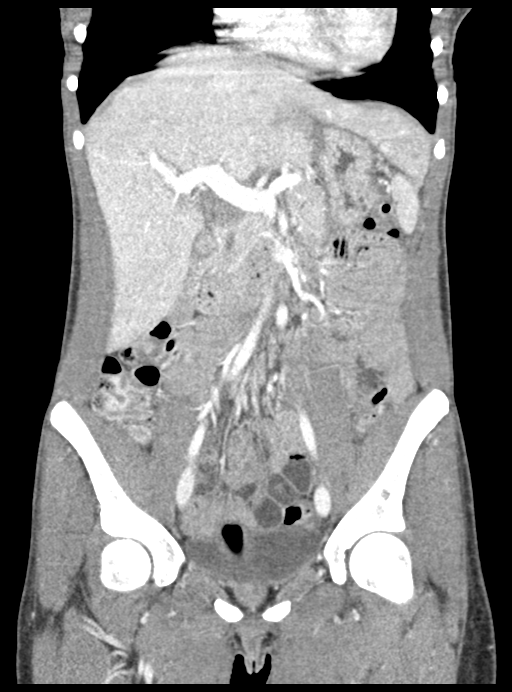
[im 37/82  soft-tissue]
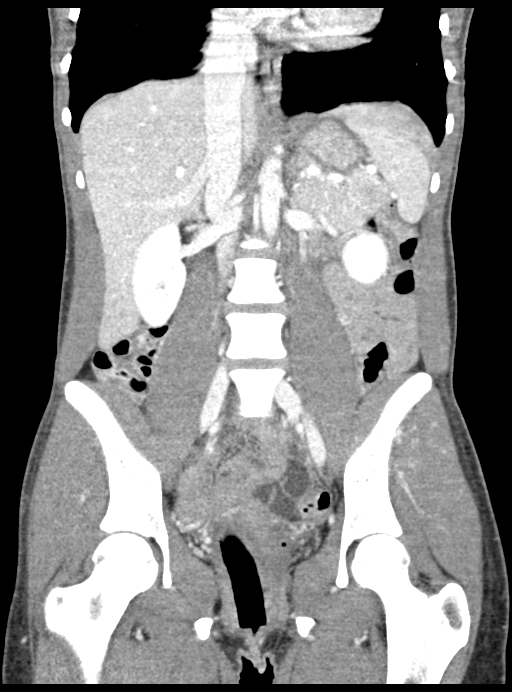
[im 46/82  soft-tissue]
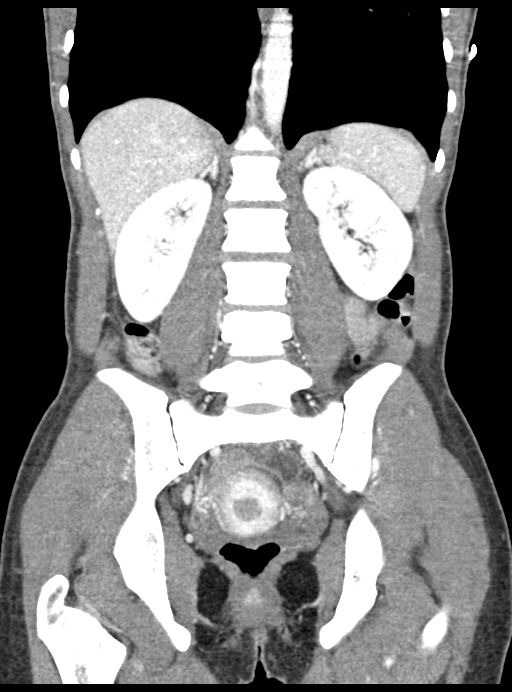

[16 of 46 positions shown; findings below may reference images not displayed]

FINDINGS: Lower chest: No acute abnormality.

Hepatobiliary: No focal hepatic abnormality. Increased intraluminal
density within the gallbladder. Note calcified stone. No biliary
dilatation.

Pancreas: Unremarkable. No pancreatic ductal dilatation or
surrounding inflammatory changes.

Spleen: Normal in size without focal abnormality.

Adrenals/Urinary Tract: Adrenal glands are unremarkable. Kidneys are
normal, without renal calculi, focal lesion, or hydronephrosis.
Bladder is unremarkable.

Stomach/Bowel: Stomach is within normal limits. Appendix appears
normal. No evidence of bowel wall thickening, distention, or
inflammatory changes.

Vascular/Lymphatic: No significant vascular findings are present. No
enlarged abdominal or pelvic lymph nodes.

Reproductive: Tampon in the vagina.  No suspicious adnexal mass

Other: No abdominal wall hernia or abnormality. No abdominopelvic
ascites.

Musculoskeletal: No acute or significant osseous findings.
IMPRESSION: 1. No CT evidence for acute intra-abdominal or pelvic abnormality.
2. Possible gallbladder sludge.

## 2023-08-07 ENCOUNTER — Other Ambulatory Visit: Payer: Self-pay

## 2023-08-07 ENCOUNTER — Emergency Department (HOSPITAL_BASED_OUTPATIENT_CLINIC_OR_DEPARTMENT_OTHER)
Admission: EM | Admit: 2023-08-07 | Discharge: 2023-08-07 | Disposition: A | Discharge status: Home / Self Care | Payer: Self-pay

## 2023-08-07 ENCOUNTER — Encounter (HOSPITAL_BASED_OUTPATIENT_CLINIC_OR_DEPARTMENT_OTHER): Payer: Self-pay | Admitting: *Deleted

## 2023-08-07 DIAGNOSIS — K0381 Cracked tooth: Secondary | ICD-10-CM | POA: Insufficient documentation

## 2023-08-07 DIAGNOSIS — K0889 Other specified disorders of teeth and supporting structures: Secondary | ICD-10-CM | POA: Insufficient documentation

## 2023-08-07 MED ORDER — OXYCODONE-ACETAMINOPHEN 5-325 MG PO TABS
1.0000 | ORAL_TABLET | Freq: Once | ORAL | Status: AC
  Administered 2023-08-07: 1 via ORAL
  Filled 2023-08-07: qty 1

## 2023-08-07 NOTE — ED Provider Notes (Signed)
  [place] EMERGENCY DEPARTMENT AT [facility] Provider Note   CSN: [id number] Arrival date & time: 08/07/23  8087     Patient presents with: Dental Pain   Michele Saunders is a 33 y.o. female.   33 year old female left upper tooth pain on and off for the past month.  No fever chills difficulty swallowing difficulty breathing.  Trial of over-the-counter medications not improving.   Dental Pain      Prior to Admission medications   Medication Sig Start Date End Date Taking? Authorizing Provider  famotidine (PEPCID) 20 MG tablet Take 20 mg by mouth 2 (two) times daily. 08/07/19   [provider]    Allergies: Patient has no known allergies.    Review of Systems  Updated Vital Signs BP 136/81   Pulse 87   Temp 97.7 F (36.5 C)   Resp 15   LMP 07/31/2023   SpO2 100%   Physical Exam Vitals and nursing note reviewed.  HENT:     Head: Normocephalic and atraumatic.     Mouth/Throat:     Mouth: Mucous membranes are moist.     Comments: Poor dentition.  Appears to have a cracked tooth in her upper left wisdom tooth.  Uvula midline.  No trismus muffled voice.  Supple neck  Neurological:     Mental Status: She is alert.     Gait: Gait normal.   Psychiatric:        Mood and Affect: Mood normal.        Behavior: Behavior normal.     (all labs ordered are listed, but only abnormal results are displayed) Labs Reviewed - No data to display  EKG: None  Radiology: No results found.   Procedures   Medications Ordered in the ED  oxyCODONE -acetaminophen  (PERCOCET/ROXICET) 5-325 MG per tablet 1 tablet (has no administration in time range)                                    Medical Decision Making 33 year old female to the emergency department for tooth pain.  Afebrile vital signs reassuring.  She only has dental caries/cracked tooth to her upper left wisdom tooth.  No periapical abscess.  Uvula midline, no trismus.  Low suspicion for deep space  infection.  No angioedema.  Given single dose of Percocet here for breakthrough pain.  Discussed ultimate treatment will need to see a dentist.  Stable for discharge at this time.  Risk Prescription drug management.      Final diagnoses:  None    ED Discharge Orders     None          Neysa Caron PARAS, DO 08/07/23 1928

## 2023-08-07 NOTE — ED Triage Notes (Signed)
 Patient to the ED POV reporting dental pain around all four wisdom teeth. Patient has been unable to make an appointment due to insurance. No fevers or abscesses noted on assessment.

## 2023-08-07 NOTE — ED Notes (Signed)
 MD at bedside.

## 2023-08-07 NOTE — Discharge Instructions (Addendum)
 Continue to take over-the-counter pain medications.  However, you will need to see a dentist for definitive care
